# Patient Record
Sex: Male | Born: 1990 | Race: White | Hispanic: No | Marital: Single | Smoking: Never smoker
Health system: Southern US, Community
[De-identification: ages and names within clinical notes are randomized; demographics above are authoritative.]

---
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
## 2013-07-15 ENCOUNTER — Ambulatory Visit: Payer: Self-pay | Admitting: Family Medicine

## 2014-03-20 ENCOUNTER — Ambulatory Visit: Payer: Self-pay | Admitting: Family Medicine

## 2015-02-20 ENCOUNTER — Ambulatory Visit: Payer: Self-pay | Admitting: Family Medicine

## 2016-02-20 ENCOUNTER — Ambulatory Visit
Admission: RE | Admit: 2016-02-20 | Discharge: 2016-02-20 | Disposition: A | Discharge status: Home / Self Care | Payer: BLUE CROSS/BLUE SHIELD | Source: Ambulatory Visit | Attending: Family Medicine | Admitting: Family Medicine

## 2016-02-20 ENCOUNTER — Other Ambulatory Visit: Payer: Self-pay | Admitting: Family Medicine

## 2016-02-20 ENCOUNTER — Ambulatory Visit
Admission: AD | Admit: 2016-02-20 | Discharge: 2016-02-20 | Disposition: A | Discharge status: Home / Self Care | Payer: BLUE CROSS/BLUE SHIELD | Source: Ambulatory Visit | Attending: Family Medicine | Admitting: Family Medicine

## 2016-02-20 DIAGNOSIS — S6991XA Unspecified injury of right wrist, hand and finger(s), initial encounter: Secondary | ICD-10-CM | POA: Diagnosis not present

## 2016-02-20 DIAGNOSIS — X58XXXA Exposure to other specified factors, initial encounter: Secondary | ICD-10-CM | POA: Diagnosis not present

## 2016-02-20 DIAGNOSIS — M79641 Pain in right hand: Secondary | ICD-10-CM | POA: Diagnosis not present

## 2016-02-20 DIAGNOSIS — M79642 Pain in left hand: Secondary | ICD-10-CM

## 2016-02-26 ENCOUNTER — Ambulatory Visit
Admission: RE | Admit: 2016-02-26 | Discharge: 2016-02-26 | Disposition: A | Discharge status: Home / Self Care | Payer: BLUE CROSS/BLUE SHIELD | Source: Ambulatory Visit | Attending: Family Medicine | Admitting: Family Medicine

## 2016-02-26 ENCOUNTER — Other Ambulatory Visit: Payer: Self-pay | Admitting: Family Medicine

## 2016-02-26 DIAGNOSIS — M79642 Pain in left hand: Secondary | ICD-10-CM

## 2016-03-01 ENCOUNTER — Other Ambulatory Visit: Payer: Self-pay | Admitting: Family Medicine

## 2016-03-01 DIAGNOSIS — S6992XA Unspecified injury of left wrist, hand and finger(s), initial encounter: Secondary | ICD-10-CM

## 2016-03-02 ENCOUNTER — Ambulatory Visit
Admission: RE | Admit: 2016-03-02 | Discharge: 2016-03-02 | Disposition: A | Discharge status: Home / Self Care | Payer: BLUE CROSS/BLUE SHIELD | Source: Ambulatory Visit | Attending: Family Medicine | Admitting: Family Medicine

## 2016-03-02 DIAGNOSIS — S6992XA Unspecified injury of left wrist, hand and finger(s), initial encounter: Secondary | ICD-10-CM

## 2016-03-02 DIAGNOSIS — S62393A Other fracture of third metacarpal bone, left hand, initial encounter for closed fracture: Secondary | ICD-10-CM | POA: Diagnosis not present

## 2016-03-02 DIAGNOSIS — X58XXXA Exposure to other specified factors, initial encounter: Secondary | ICD-10-CM | POA: Insufficient documentation

## 2016-03-07 ENCOUNTER — Ambulatory Visit (INDEPENDENT_AMBULATORY_CARE_PROVIDER_SITE_OTHER): Payer: BLUE CROSS/BLUE SHIELD | Admitting: Family Medicine

## 2016-03-07 ENCOUNTER — Encounter: Payer: Self-pay | Admitting: Family Medicine

## 2016-03-07 DIAGNOSIS — S6292XA Unspecified fracture of left wrist and hand, initial encounter for closed fracture: Secondary | ICD-10-CM

## 2016-03-07 NOTE — Progress Notes (Signed)
Patient ID: Victor Townsend, male   DOB: 12/18/1991, 25 y.o.   MRN: [id number] Patient presents today regarding injury to the left hand. The injury happened on 02/19/16. Patient states that a ball hit his hand during baseball. He believes a ball was going about 80-90 miles an hour. Initially he did have swelling and moderate amount of pain. An x-ray was done which did not show an acute fracture however a irregularity to the third MCP area which appeared to the radiologist to be an old injury. The area was treated as a contusion by the trainer. He continued to have pain with gripping of the bat. Repeat x-rays were done which did not show any new findings. A CT scan of the hand was then ordered to look for an acute fracture. This did show an acute fracture of the distal aspect of the third metacarpal/MCP. Patient has now been in a gutter splint for a few days. Patient recalls a previous fracture to the left hand when he was a child but is unsure about the location of the fracture.   ROS: Negative except mentioned above.  Vitals as per Epic.  GENERAL: NAD RESP: CTA B CARD: RRR MSK: Left Hand- in gutter splint   NEURO: CN II-XII grossly intact   A/P: Left 3rd Metacarpal Fracture- discussed changing the splint a little so that the patient has function of his first and second digit. Would recommend evaluation by Dr. Ardine Engiehl, who the patient will see on Wednesday. Length of treatment in the splint or anything else needed will be discussed at that time.

## 2016-04-19 ENCOUNTER — Ambulatory Visit (INDEPENDENT_AMBULATORY_CARE_PROVIDER_SITE_OTHER): Payer: BLUE CROSS/BLUE SHIELD | Admitting: Family Medicine

## 2016-04-19 ENCOUNTER — Encounter: Payer: Self-pay | Admitting: Family Medicine

## 2016-04-19 VITALS — BP 118/58 | HR 53 | Temp 97.6°F | Resp 16

## 2016-04-19 DIAGNOSIS — S060X0A Concussion without loss of consciousness, initial encounter: Secondary | ICD-10-CM | POA: Diagnosis not present

## 2016-04-19 NOTE — Progress Notes (Signed)
Patient ID: Victor Townsend, male   DOB: 10/13/1991, 25 y.o.   MRN: [id number]  Patient presents today after being hit in the face with a baseball. Patient states that the ball hit his helmet and then hit his right eye. Patient was at an away game at the time. Sutures were placed on the right upper eyelid. Patient denies any vision problems. This injury happened on a Friday and on Saturday the patient states that he didn't have any symptoms except for some mild discomfort of the right eye. He states that on Sunday he started to develop symptoms such as headache, fatigue, nausea. The symptoms were at its worst on Sunday night. He now has just a minimal headache and nausea. He has not had any vomiting. He admits to having 1 other previous concussion. He did have some sensitivity to light which he states is better and only experiences this when lights are turned on quickly. He does admit to having some difficulty with concentration for long periods.  ROS: Negative except mentioned above. Vitals as per Epic.  GENERAL: NAD HEENT: no pharyngeal erythema, no exudate, no erythema of TMs, PERRL, EOMI, no nystagmus appreciated, sutures intact on right upper eyelid, no orbital tenderness or step-off appreciated RESP: CTA B CARD: RRR NEURO: CN II-XII grossly intact, -Rombergs  A/P: Concussion-though patient's symptoms did start a day or so after the injury given his current symptoms I still would consider this to be a concussion, patient's symptoms have improved since Sunday. Tylenol when necessary. Follow protocol. Answered patient's questions. Seek medical attention if symptoms worsen as discussed. Sutures are to be taken out in 5-7 days from when they were placed. He can return here to see me to have this done.

## 2016-04-28 ENCOUNTER — Ambulatory Visit (INDEPENDENT_AMBULATORY_CARE_PROVIDER_SITE_OTHER): Payer: BLUE CROSS/BLUE SHIELD | Admitting: Family Medicine

## 2016-04-28 DIAGNOSIS — S62309S Unspecified fracture of unspecified metacarpal bone, sequela: Secondary | ICD-10-CM | POA: Diagnosis not present

## 2016-04-28 DIAGNOSIS — M6289 Other specified disorders of muscle: Secondary | ICD-10-CM | POA: Diagnosis not present

## 2016-04-28 DIAGNOSIS — R29898 Other symptoms and signs involving the musculoskeletal system: Secondary | ICD-10-CM

## 2016-04-28 NOTE — Progress Notes (Signed)
Patient ID: Victor Townsend, male   DOB: 09/16/1991, 25 y.o.   MRN: [id number]  Patient presents today for his exit physical. Patient is here to talk about his left hand injury that he had this year. There was a left third metacarpal fracture. This was identified on CT and not x-rays. Patient states that at times he does feel that the left hand is slightly weaker than the right with grip. He denies any obvious problems in the weight room however with lifting. Trainer states that she has not seen any problems with baseball activities or in the weight room. Patient denies any pain of the area. He also denies any swelling. There is no obvious deformity of the hand. 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  ROS: Negative except mentioned above. Vitals as per Epic. GENERAL: NAD MSK: L Hand- no obvious deformity, no swelling, no tenderness to palpation, no strength deficits appreciated on exam with testing grip, normal abduction/abduction of digits, this was tested against resistance as well which did not show any significant problems, normal ability to make a fist, nv intact  A/P: L Hand Fracture- no obvious pain or strength deficits appreciated in the office, if there is some question about the ulnar collateral ligament being affected a MRI would be helpful, patient states that he is not having any difficulties with activities of daily living, he is unsure whether he will have 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 He knows that he will be lifting. I advised the patient that if he does notice that he starts to have difficulty with strength or pain in the area once he starts working we can refer him to Dr. Ardine Engiehl and/or Dr. Gerlene Burdockichard. Patient is comfortable with this plan.

## 2017-06-17 IMAGING — CR DG HAND COMPLETE 3+V*L*
1 series · 3 of 3 positions shown · non-contrast
Comparison: 02/20/2016.

CLINICAL DATA: Injury.  Follow-up evaluation.

EXAM:
LEFT HAND - COMPLETE 3+ VIEW

[Series 1: dg hand complete left · 0.14mm/px · 3 of 3 slices shown]
[im 1/3]
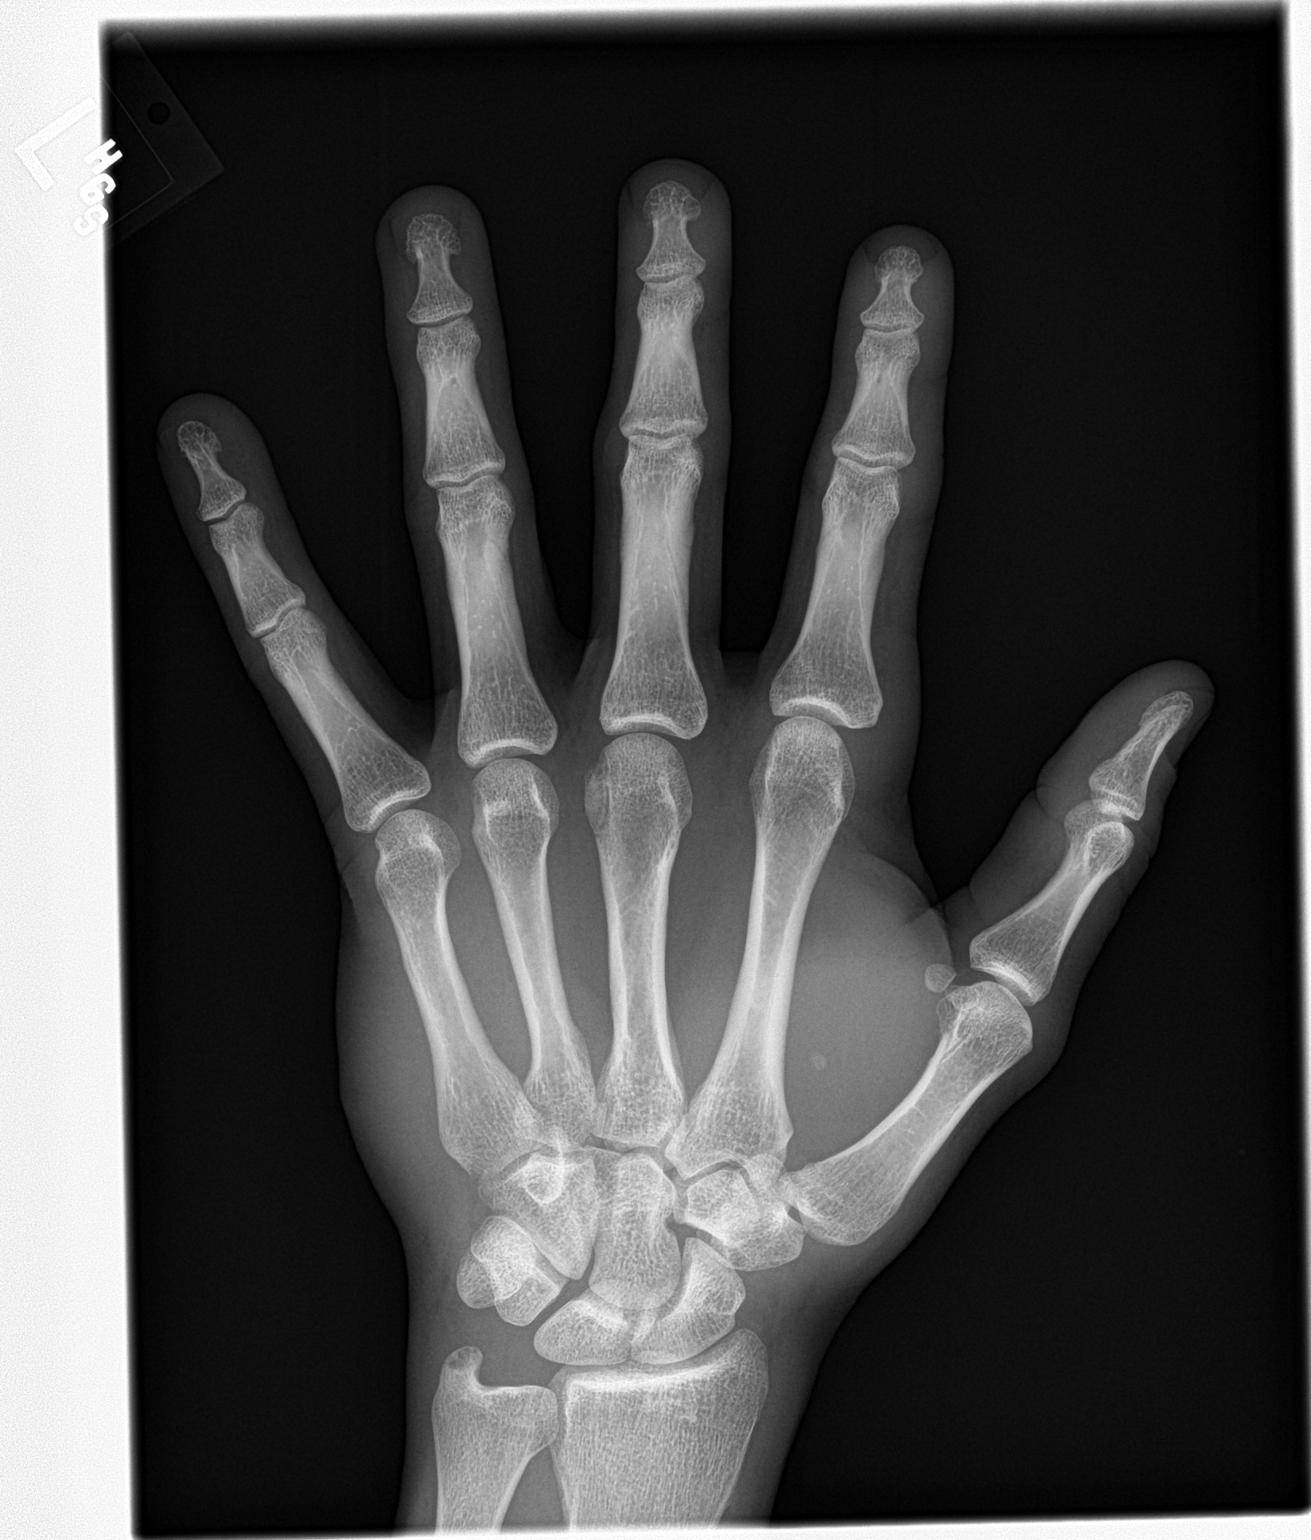
[im 2/3]
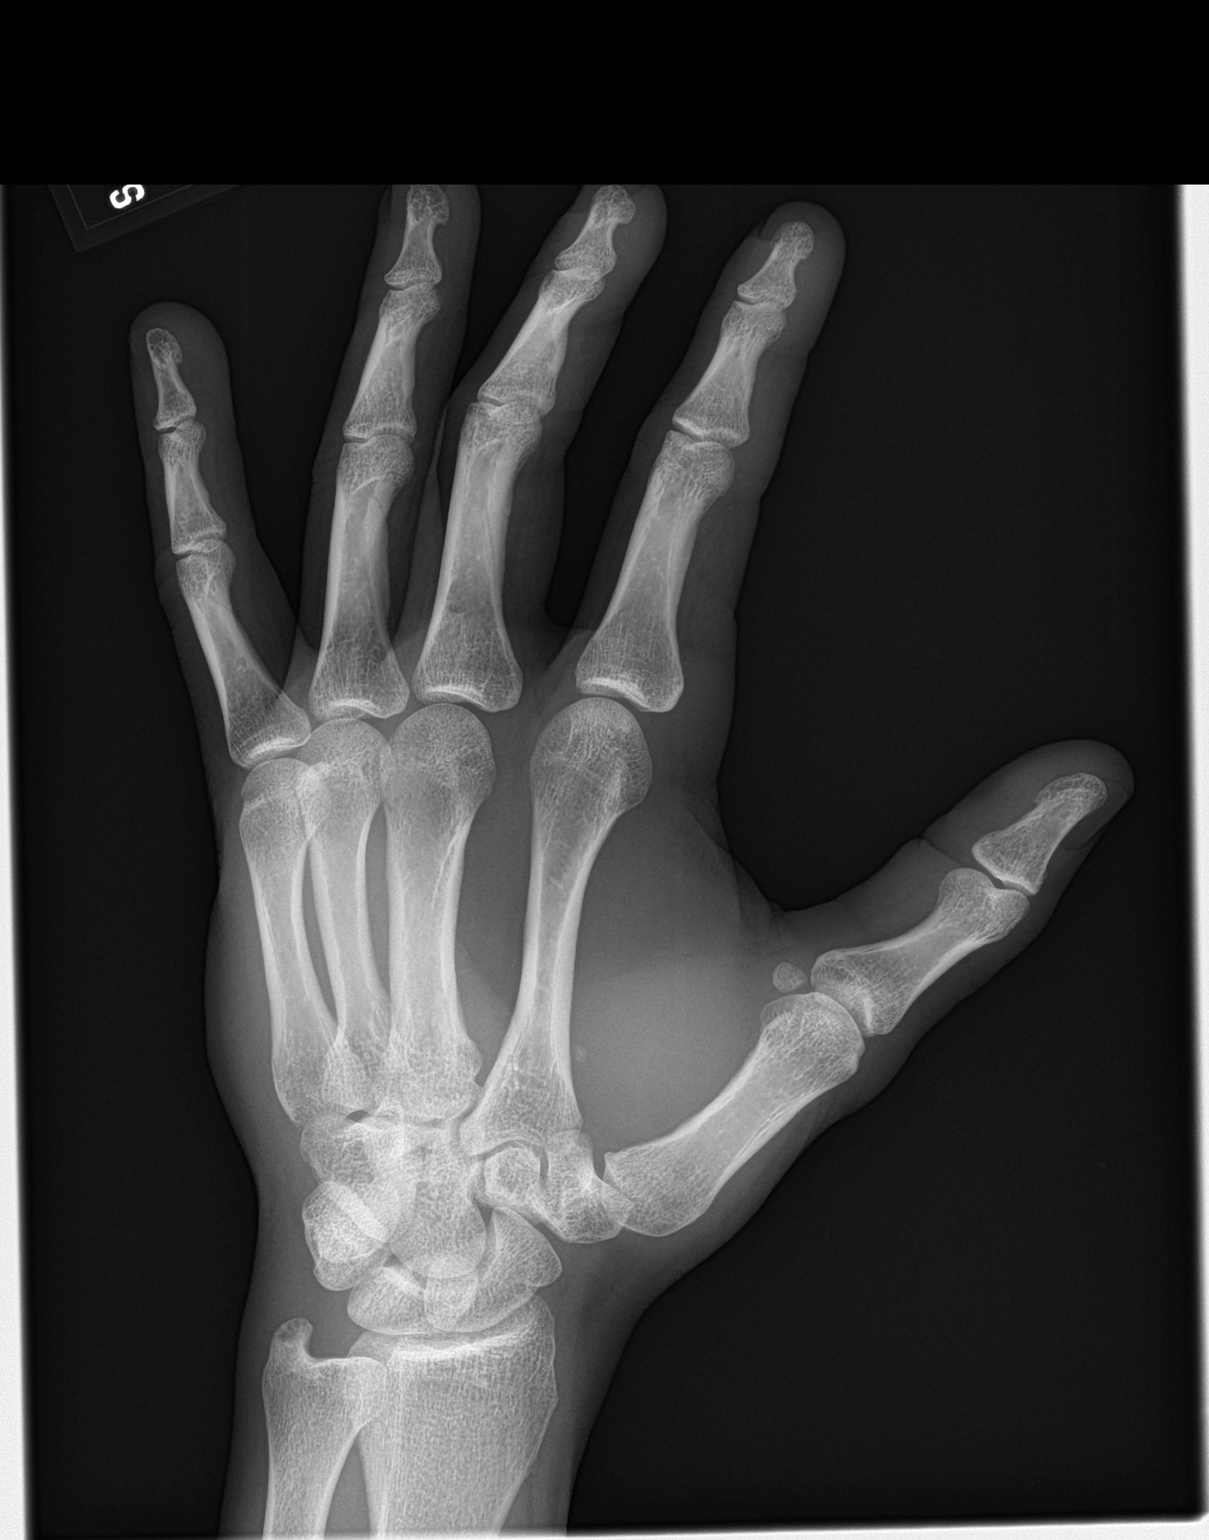
[im 3/3]
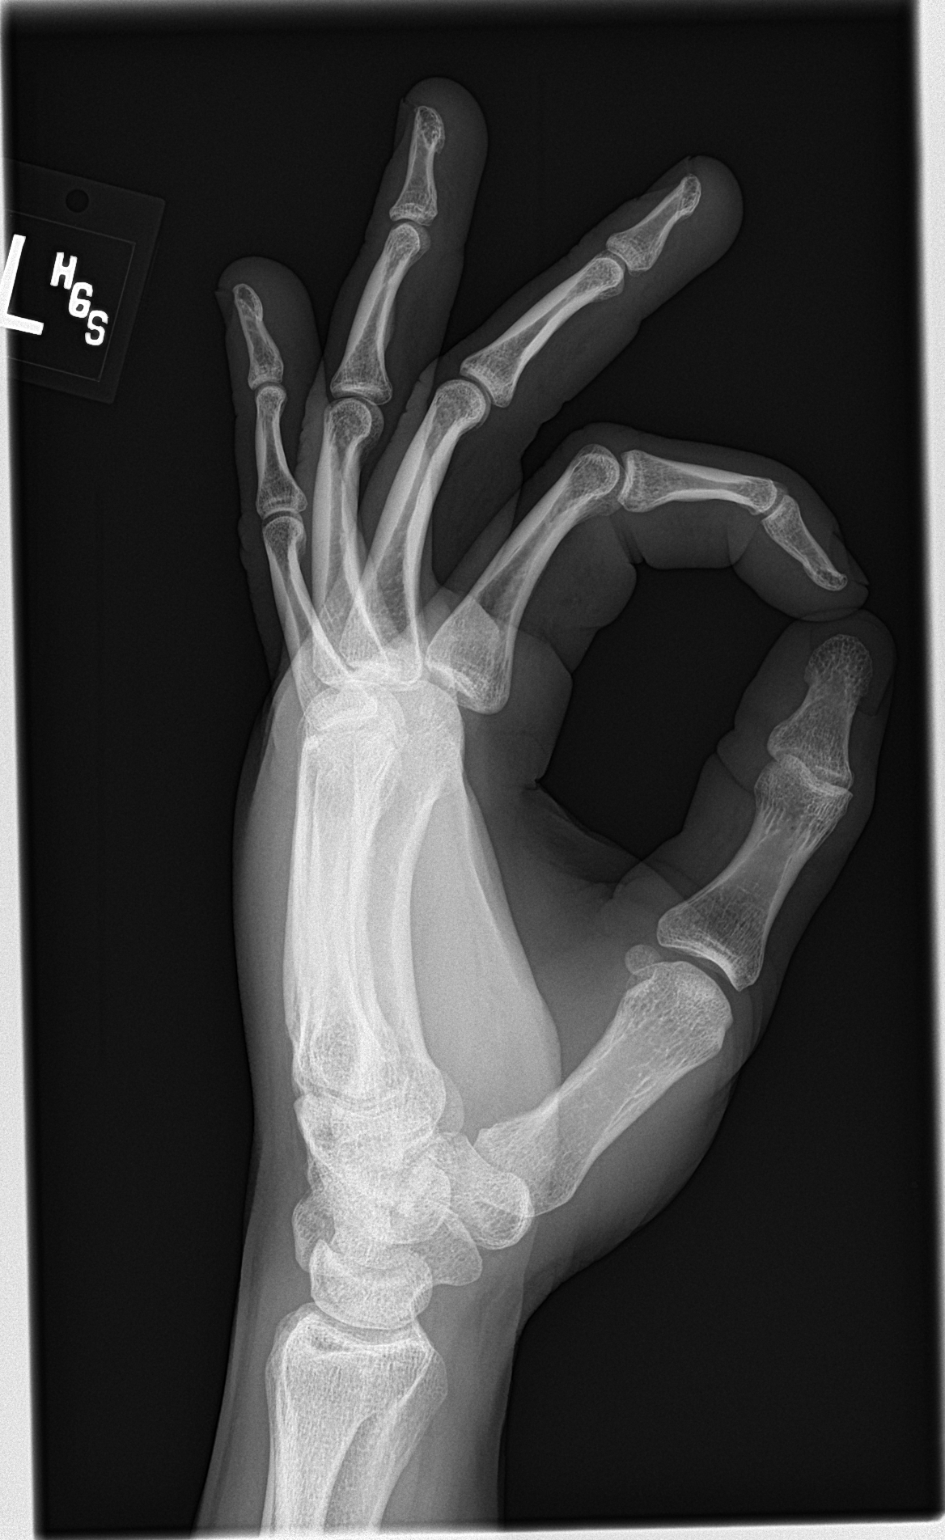

[3 of 3 positions shown; findings below may reference images not displayed]

FINDINGS: No acute bony or joint abnormality identified. Tiny cyst in the
ulnar styloid. Corticated lucency noted along the ulnar aspect of
the distal third metacarpal, unchanged, most likely old . Punctate
calcific density noted over the interspace between the first and
second metatarsals. This may represent focal area of dystrophic
calcification. Foreign body cannot be excluded.
IMPRESSION: 1. Small rounded calcific density noted in the interspace between
the first and second metatarsals. This may represent a focus of
dystrophic calcification. Tiny foreign body cannot be excluded.

2. Corticated lucency noted along the ulnar aspect of the distal
third metacarpal is unchanged and most likely old. Possibly an old
fracture. If symptoms persist MRI can be obtained. No acute bony
abnormality otherwise noted.

## 2017-06-22 IMAGING — CT CT HAND*L* W/O CM
4 of 6 series · 16 of 33 positions shown, 18 images · non-contrast
Comparison: Multiple exams, including 02/26/2016

CLINICAL DATA: Baseball injury to the left hand 2 weeks ago.
Continued swelling. Pain with gripping.

EXAM:
CT OF THE LEFT HAND WITHOUT CONTRAST
TECHNIQUE: Multidetector CT imaging of the left hand was performed according to
the standard protocol. Multiplanar CT image reconstructions were
also generated.

[Series 3: axial bone · axial · 0.41mm/px · z∈[-216,-60]mm · 5 of 391 slices shown, 7 images]
[im 66/391  soft-tissue]
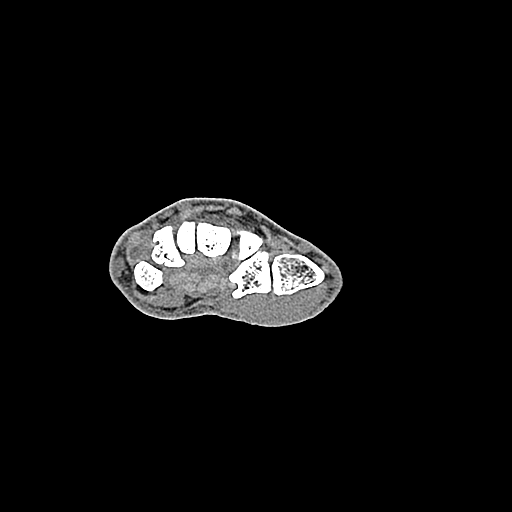
[im 66/391  bone]
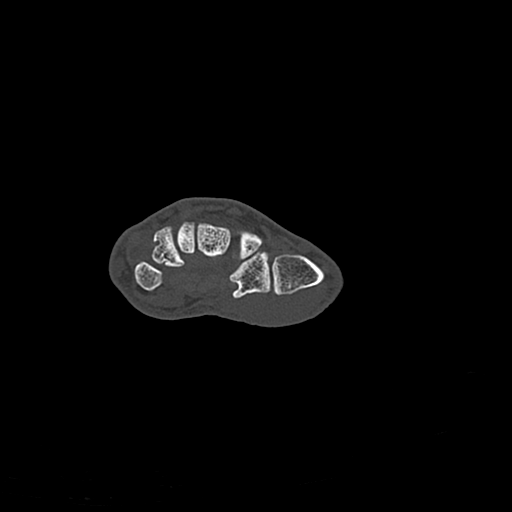
[im 131/391  bone]
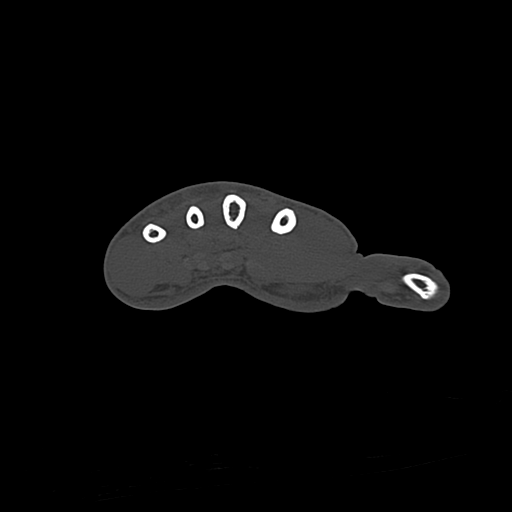
[im 196/391  bone]
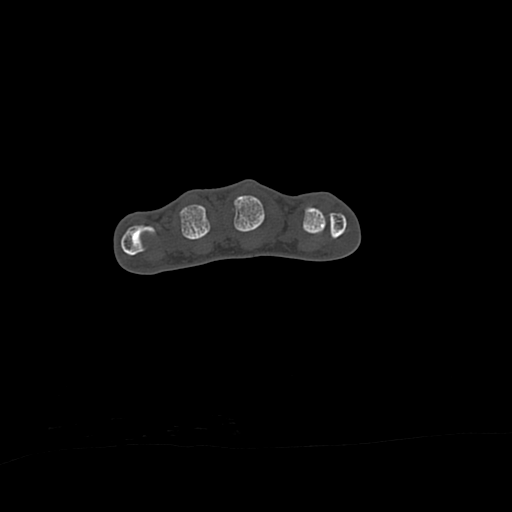
[im 261/391  bone]
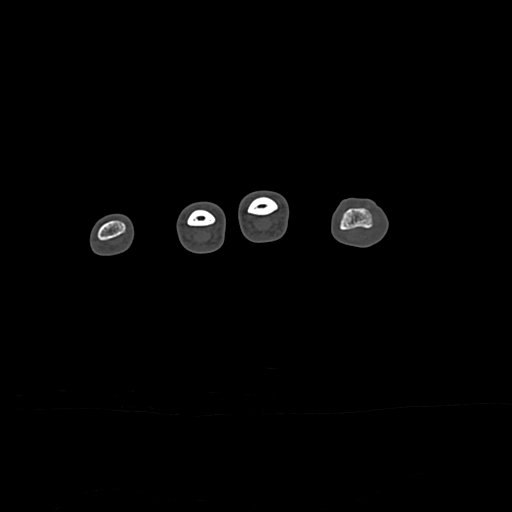
[im 326/391  soft-tissue]
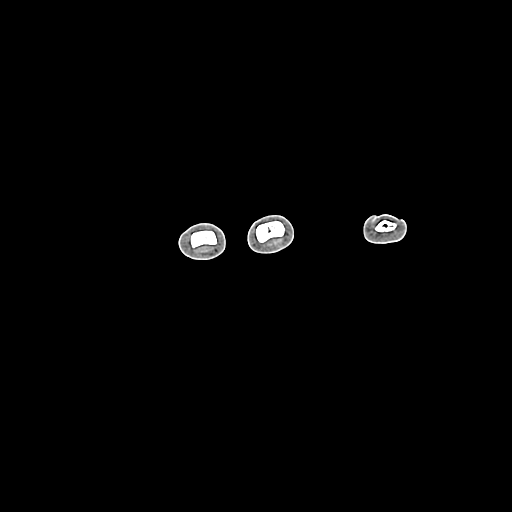
[im 326/391  bone]
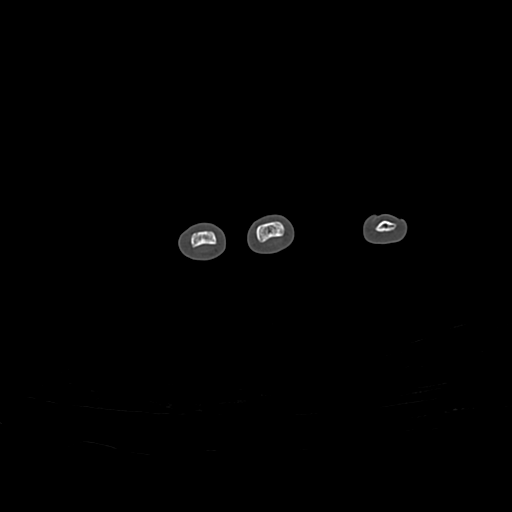

[Series 5: soft tissue · axial · 0.41mm/px · z∈[-218,-68]mm · 5 of 377 slices shown]
[im 63/377  soft-tissue]
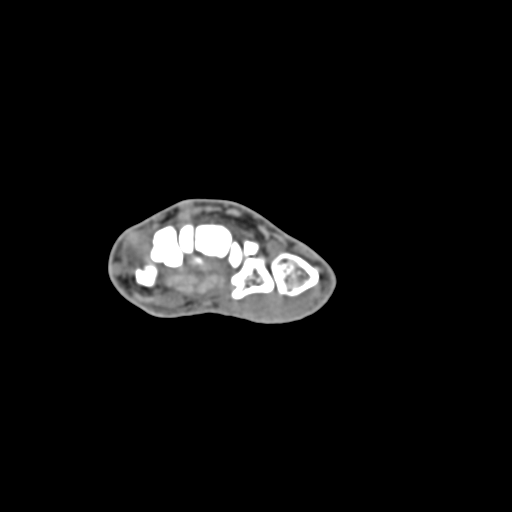
[im 126/377  soft-tissue]
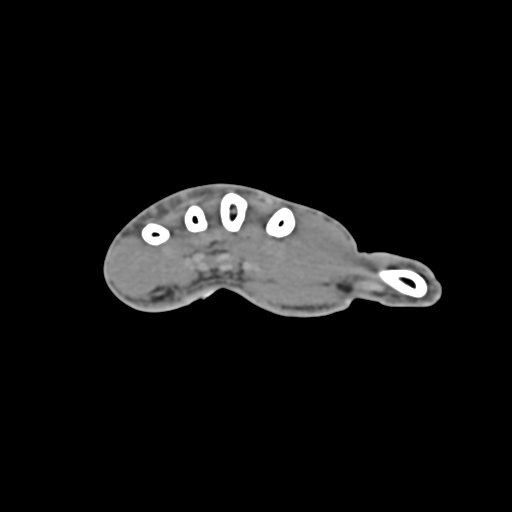
[im 189/377  soft-tissue]
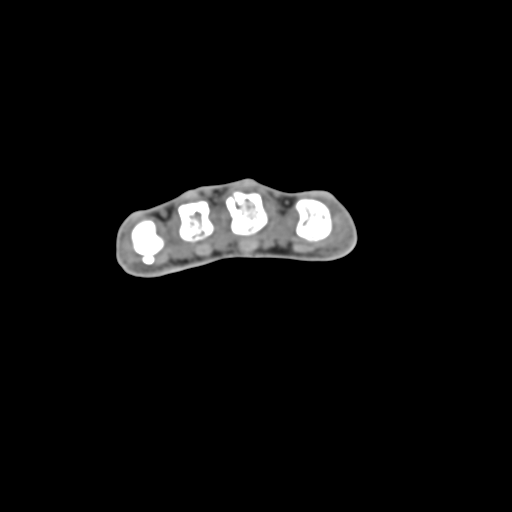
[im 251/377  soft-tissue]
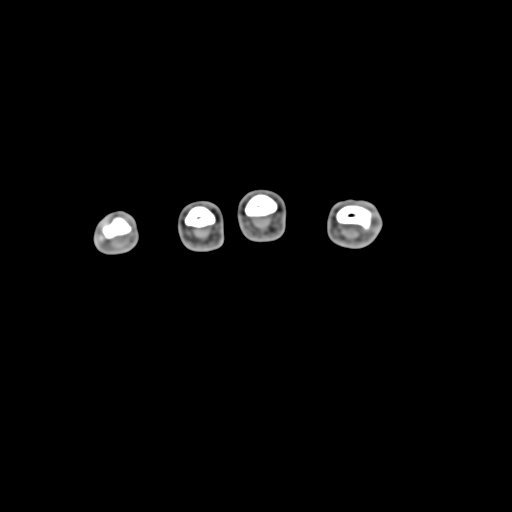
[im 314/377  soft-tissue]
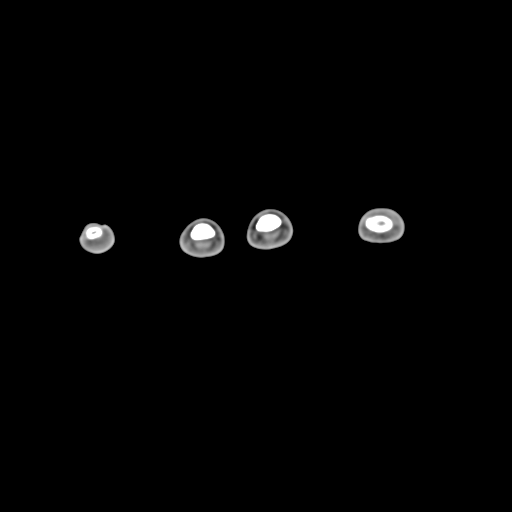

[Series 603: sagittal bone · sagittal · 0.46mm/px · 5 of 119 slices shown]
[im 20/119  bone]
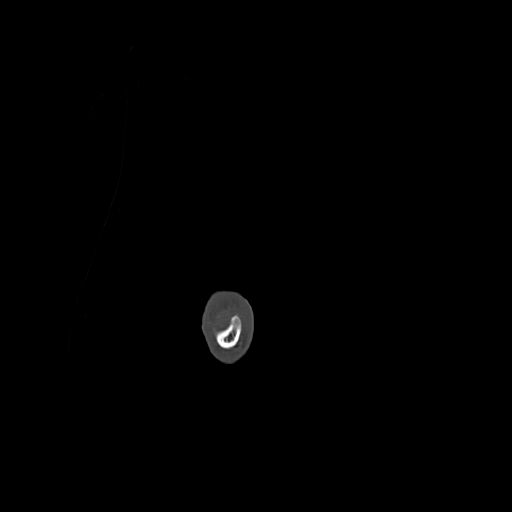
[im 40/119  bone]
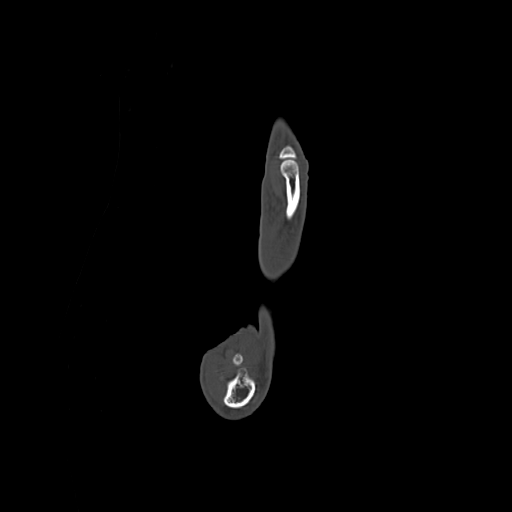
[im 60/119  bone]
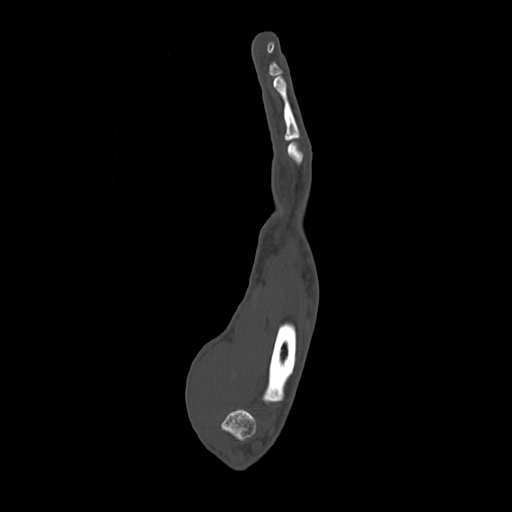
[im 79/119  bone]
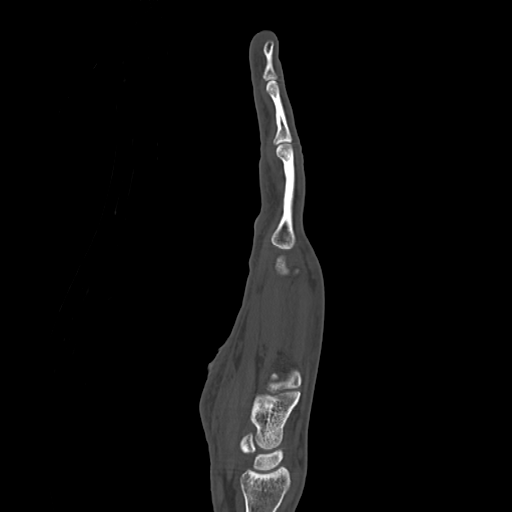
[im 99/119  bone]
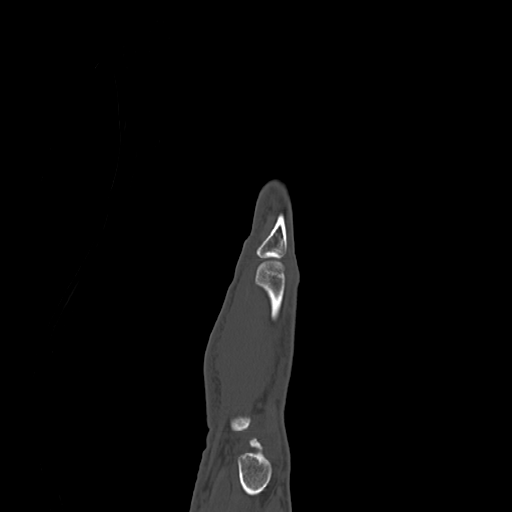

[Series 604: coronal bone . · coronal · 0.46mm/px · 1 of 49 slices shown]
[im 25/49  bone]
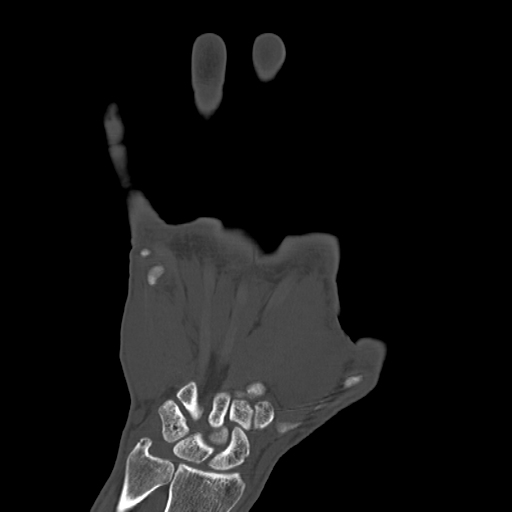

[16 of 33 positions shown; findings below may reference images not displayed]

FINDINGS: Acute longitudinal fracture of the medial head of the third
metacarpal. This seemed corticated on the conventional radiographs,
even in retrospect, but on CT turns out to not be corticated and
with an irregular margin compatible with acute fracture. Image 10
series 604 shows this fracture. The resulting medial fragment
measures 1.2 by 0.4 by 0.9 cm. The ulnar collateral ligament is
thought to attach in the vicinity of this fragment, and the fracture
does have an intra-articular component.

No other fractures are identified.
IMPRESSION: 1. Acute longitudinal fracture of the medial head of the third
metatarsal extending from the medial metaphysis to the medial distal
articular surface, partially in the vicinity of the ulnar collateral
ligament attachment. This appeared well corticated and accordingly
likely chronic on conventional radiographs, but on CT is
demonstrated to not be corticated and therefore acute.
